# Patient Record
Sex: Female | Born: 1995 | Race: White | Hispanic: No | Marital: Single | Smoking: Never smoker
Health system: Southern US, Community
[De-identification: ages and names within clinical notes are randomized; demographics above are authoritative.]

## PROBLEM LIST (undated history)

## (undated) DIAGNOSIS — J45909 Unspecified asthma, uncomplicated: Secondary | ICD-10-CM

---
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
## 2016-10-16 IMAGING — CT CT HEAD W/O CM
1 series · 16 of 30 positions shown, 20 images · non-contrast
Comparison: None.

CLINICAL DATA: Altered mental status, alcohol intoxication, fall.
Possible knee injury.

EXAM:
CT HEAD WITHOUT CONTRAST
TECHNIQUE: Contiguous axial images were obtained from the base of the skull
through the vertex without intravenous contrast.

[Series 2: head wo · axial · 0.39mm/px · z∈[-11,+121]mm · 16 of 30 slices shown, 20 images]
[im 2/30  brain]
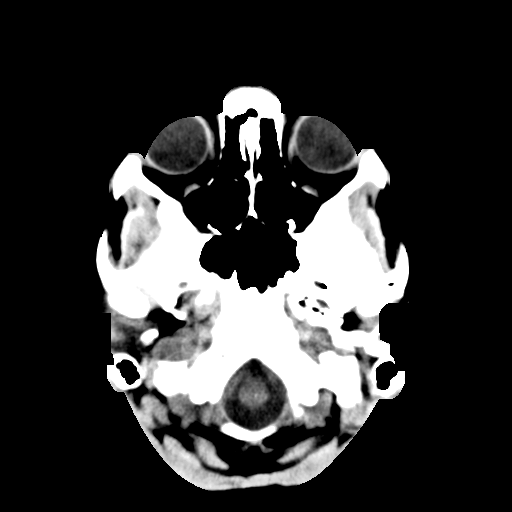
[im 2/30  bone]
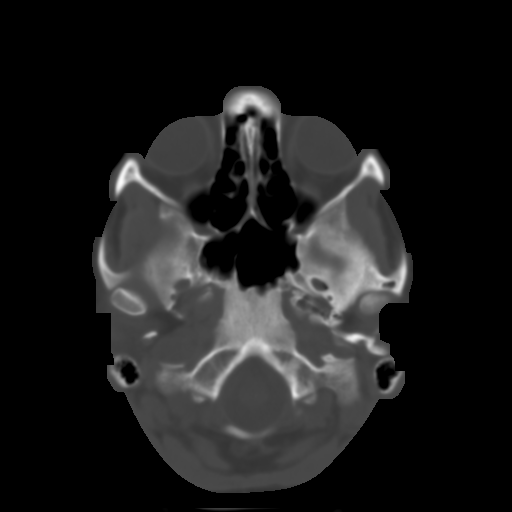
[im 4/30  brain]
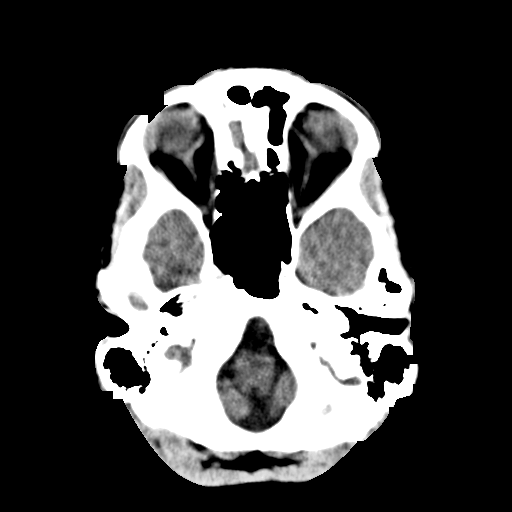
[im 6/30  brain]
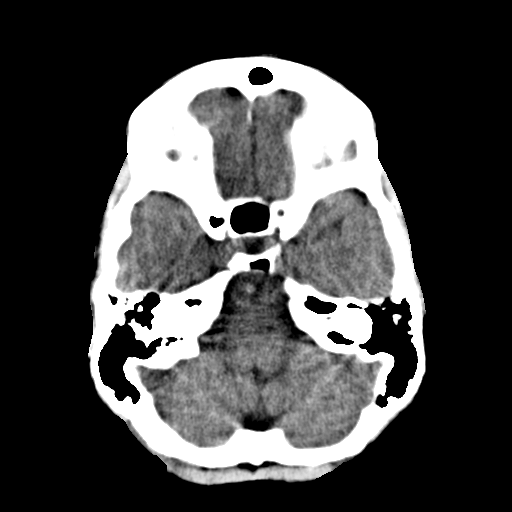
[im 8/30  brain]
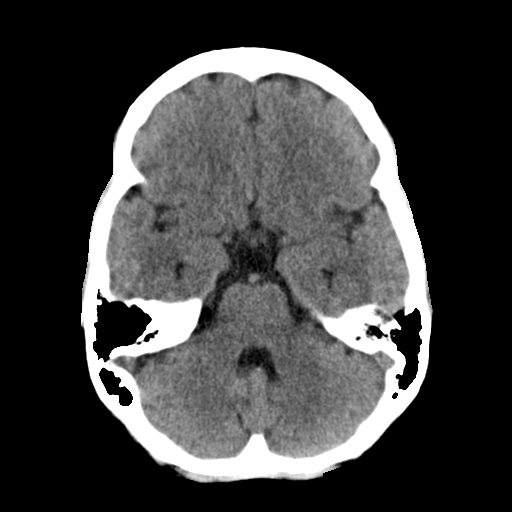
[im 9/30  brain]
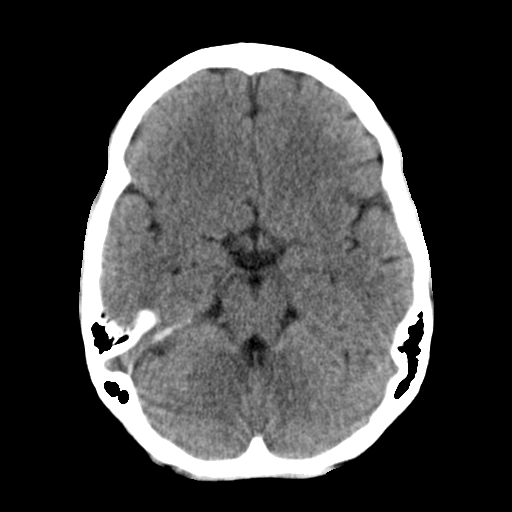
[im 9/30  bone]
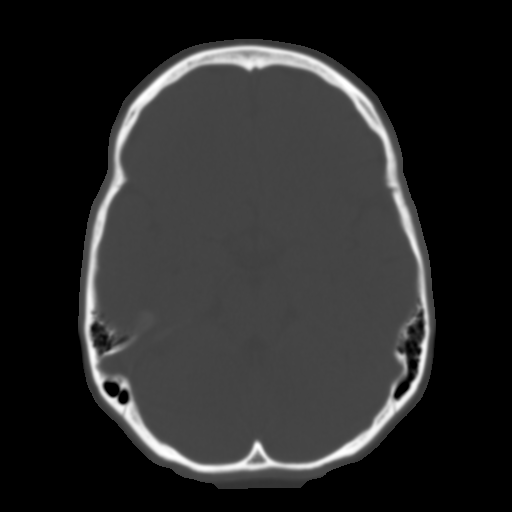
[im 11/30  brain]
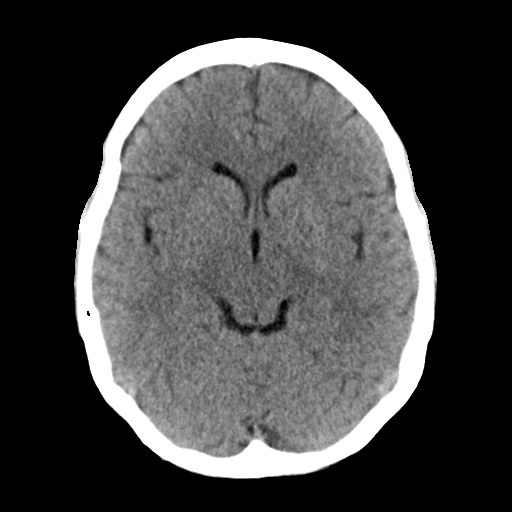
[im 13/30  brain]
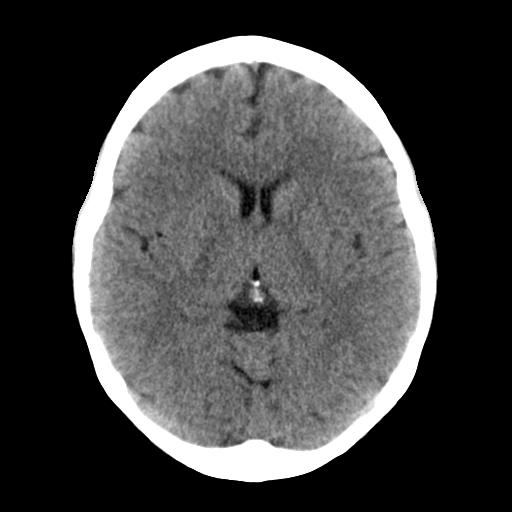
[im 15/30  brain]
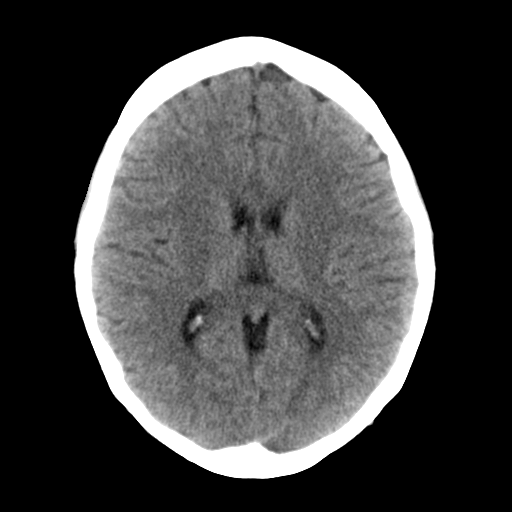
[im 16/30  brain]
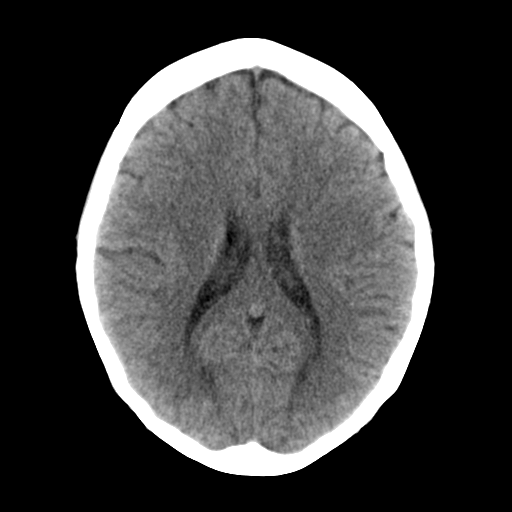
[im 16/30  bone]
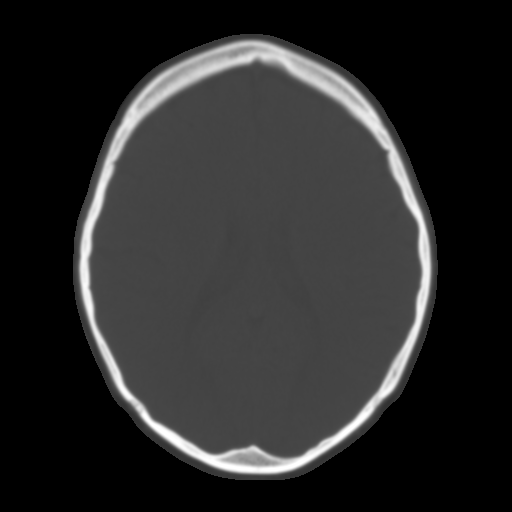
[im 18/30  brain]
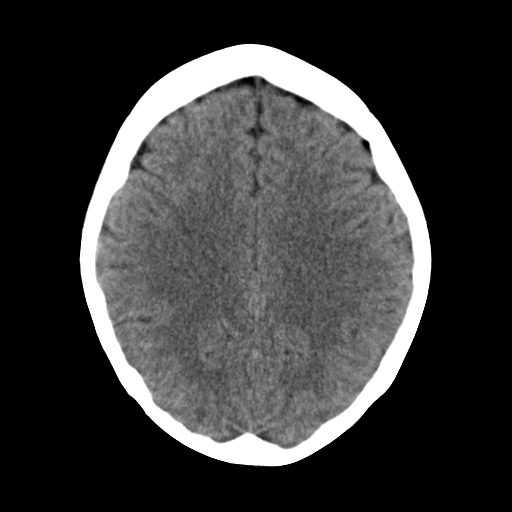
[im 20/30  brain]
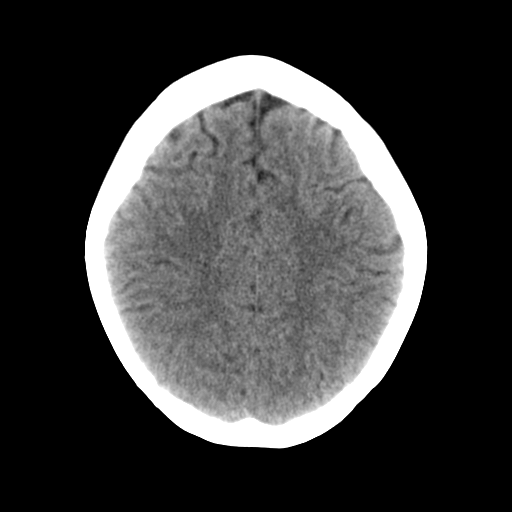
[im 22/30  brain]
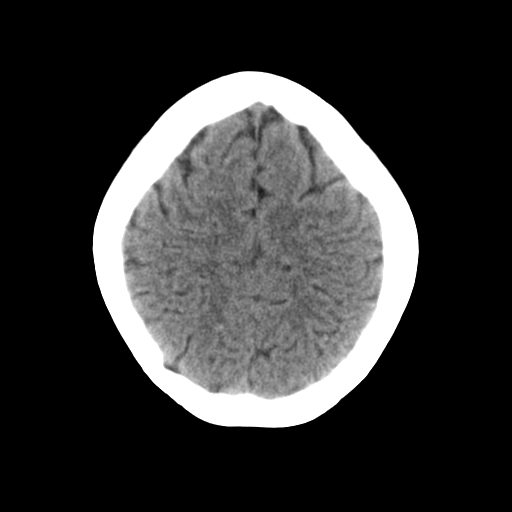
[im 23/30  brain]
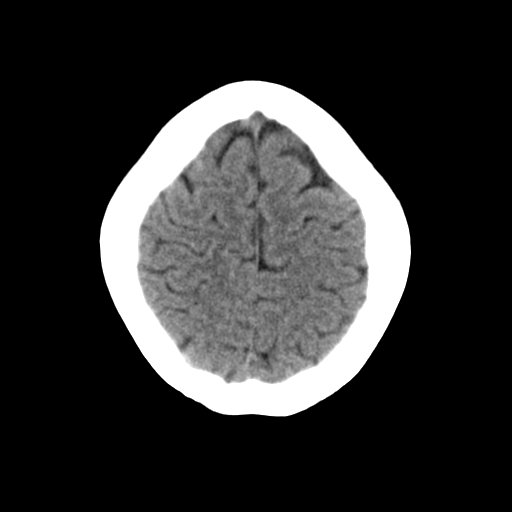
[im 23/30  bone]
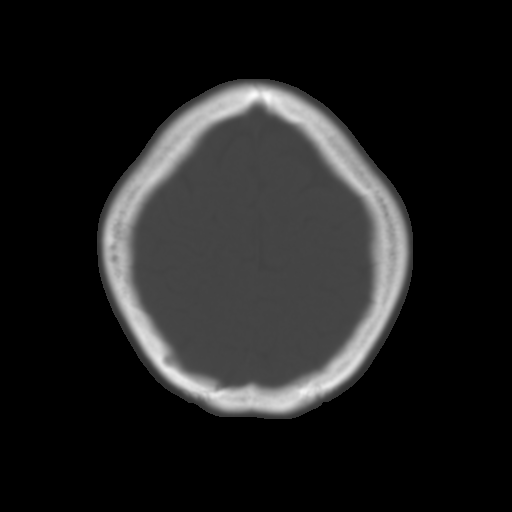
[im 25/30  brain]
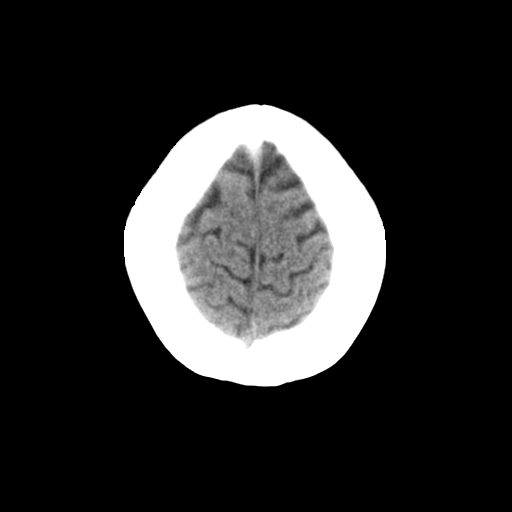
[im 27/30  brain]
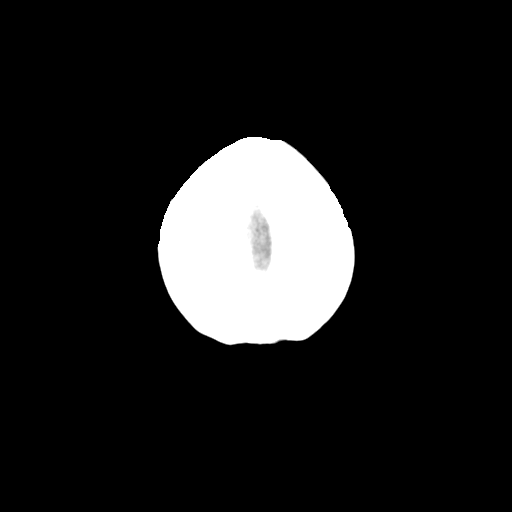
[im 29/30  brain]
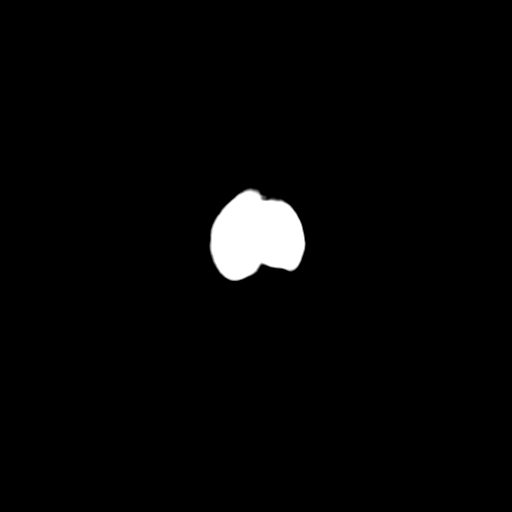

[16 of 30 positions shown; findings below may reference images not displayed]

FINDINGS: The ventricles and sulci are normal. No intraparenchymal hemorrhage,
mass effect nor midline shift. No acute large vascular territory
infarcts.

No abnormal extra-axial fluid collections. Basal cisterns are
patent.

No skull fracture. The included ocular globes and orbital contents
are non-suspicious. The mastoid aircells and included paranasal
sinuses are well-aerated.
IMPRESSION: Normal noncontrast CT head.

## 2018-02-18 ENCOUNTER — Other Ambulatory Visit: Payer: Self-pay

## 2018-02-18 ENCOUNTER — Emergency Department
Admission: EM | Admit: 2018-02-18 | Discharge: 2018-02-18 | Disposition: A | Discharge status: Home / Self Care | Payer: BLUE CROSS/BLUE SHIELD | Attending: Emergency Medicine | Admitting: Emergency Medicine

## 2018-02-18 ENCOUNTER — Encounter: Payer: Self-pay | Admitting: Emergency Medicine

## 2018-02-18 ENCOUNTER — Emergency Department: Payer: BLUE CROSS/BLUE SHIELD

## 2018-02-18 DIAGNOSIS — Y939 Activity, unspecified: Secondary | ICD-10-CM | POA: Diagnosis not present

## 2018-02-18 DIAGNOSIS — S62316A Displaced fracture of base of fifth metacarpal bone, right hand, initial encounter for closed fracture: Secondary | ICD-10-CM | POA: Diagnosis not present

## 2018-02-18 DIAGNOSIS — S6991XA Unspecified injury of right wrist, hand and finger(s), initial encounter: Secondary | ICD-10-CM | POA: Diagnosis present

## 2018-02-18 DIAGNOSIS — Y999 Unspecified external cause status: Secondary | ICD-10-CM | POA: Diagnosis not present

## 2018-02-18 DIAGNOSIS — J45909 Unspecified asthma, uncomplicated: Secondary | ICD-10-CM | POA: Insufficient documentation

## 2018-02-18 DIAGNOSIS — Y92214 College as the place of occurrence of the external cause: Secondary | ICD-10-CM | POA: Diagnosis not present

## 2018-02-18 DIAGNOSIS — S62339A Displaced fracture of neck of unspecified metacarpal bone, initial encounter for closed fracture: Secondary | ICD-10-CM

## 2018-02-18 DIAGNOSIS — W2209XA Striking against other stationary object, initial encounter: Secondary | ICD-10-CM | POA: Diagnosis not present

## 2018-02-18 DIAGNOSIS — S60221A Contusion of right hand, initial encounter: Secondary | ICD-10-CM

## 2018-02-18 MED ORDER — IBUPROFEN 600 MG PO TABS
600.0000 mg | ORAL_TABLET | Freq: Once | ORAL | Status: AC
  Administered 2018-02-18: 600 mg via ORAL
  Filled 2018-02-18: qty 1

## 2018-02-18 MED ORDER — HYDROCODONE-ACETAMINOPHEN 5-325 MG PO TABS
0.5000 | ORAL_TABLET | Freq: Once | ORAL | Status: AC
  Administered 2018-02-18: 1 via ORAL
  Filled 2018-02-18: qty 1

## 2018-02-18 MED ORDER — HYDROCODONE-ACETAMINOPHEN 5-325 MG PO TABS: 1.0000 | ORAL_TABLET | Freq: Four times a day (QID) | ORAL | 0 refills | Status: AC | PRN

## 2018-02-18 MED ORDER — IBUPROFEN 600 MG PO TABS: 600.0000 mg | ORAL_TABLET | Freq: Three times a day (TID) | ORAL | 0 refills | Status: DC | PRN

## 2018-02-18 NOTE — ED Notes (Addendum)
Pt states on Friday she punched a wall and now has bruising and swelling to outer aspect of posterior right hand. Pulses pos, circ wnl, limited ROM due to pain.

## 2018-02-18 NOTE — ED Notes (Signed)
Patient discharged to home per MD order. Patient in stable condition, and deemed medically cleared by ED provider for discharge. Discharge instructions reviewed with patient/family using "Teach Back"; verbalized understanding of medication education and administration, and information about follow-up care. Denies further concerns. ° °

## 2018-02-18 NOTE — ED Notes (Signed)
Splint applied to right hand per ED tech, pt discharged home with instructions.

## 2018-02-18 NOTE — ED Provider Notes (Signed)
[facility] Emergency Department Provider Note   ____________________________________________   First MD Initiated Contact with Patient 02/18/18 0129     (approximate)  I have reviewed the triage vital signs and the nursing notes.   HISTORY  Chief Complaint Hand Injury    HPI Shelby Reyes is a 22 y.o. female who [facility] with a chief complaint of right hand pain and injury.  Patient reports she punched a wall 2 nights ago with her dominant hand.  Presents with painful and swollen right hand.  Denies numbness or tingling.  Denies other injuries or complaints.  Denies human bite.   Past Medical History:  Diagnosis Date  . Asthma     There are no active problems to display for this patient.   History reviewed. No pertinent surgical history.  Prior to Admission medications   Medication Sig Start Date End Date Taking? Authorizing Provider  HYDROcodone-acetaminophen (NORCO) 5-325 MG tablet Take 1 tablet by mouth every 6 (six) hours as needed for moderate pain. 02/18/18   Irean Hong, MD  ibuprofen (ADVIL,MOTRIN) 600 MG tablet Take 1 tablet (600 mg total) by mouth every 8 (eight) hours as needed. 02/18/18   Irean Hong, MD    Allergies Patient has no known allergies.  No family history on file.  Social History Social History   Tobacco Use  . Smoking status: Never Smoker  . Smokeless tobacco: Never Used  Substance Use Topics  . Alcohol use: Yes  . Drug use: No    Review of Systems  Constitutional: No fever/chills. Eyes: No visual changes. ENT: No sore throat. Cardiovascular: Denies chest pain. Respiratory: Denies shortness of breath. Gastrointestinal: No abdominal pain.  No nausea, no vomiting.  No diarrhea.  No constipation. Genitourinary: Negative for dysuria. Musculoskeletal: Positive for right hand pain and injury.  Negative for back pain. Skin: Negative for rash. Neurological: Negative for headaches,  focal weakness or numbness.   ____________________________________________   PHYSICAL EXAM:  VITAL SIGNS: ED Triage Vitals  Enc Vitals Group     BP 02/18/18 0028 (!) 142/93     Pulse Rate 02/18/18 0028 (!) 104     Resp 02/18/18 0028 16     Temp 02/18/18 0028 98 F (36.7 C)     Temp Source 02/18/18 0028 Oral     SpO2 02/18/18 0028 99 %     Weight 02/18/18 0029 125 lb (56.7 kg)     Height 02/18/18 0029  (1.626 m)     Head Circumference --      Peak Flow --      Pain Score 02/18/18 0029 6     Pain Loc --      Pain Edu? --      Excl. in GC? --     Constitutional: Alert and oriented. Well appearing and in no acute distress. Eyes: Conjunctivae are normal. PERRL. EOMI. Head: Atraumatic. Nose: No congestion/rhinnorhea. Mouth/Throat: Mucous membranes are moist.  Oropharynx non-erythematous. Neck: No stridor.  No cervical spine tenderness to palpation. Cardiovascular: Normal rate, regular rhythm. Grossly normal heart sounds.  Good peripheral circulation. Respiratory: Normal respiratory effort.  No retractions. Lungs CTAB. Gastrointestinal: Soft and nontender. No distention. No abdominal bruits. No CVA tenderness. Musculoskeletal:  Right hand: Mild swelling and ecchymosis over dorsal fourth and fifth metatarsal areas.  Limited range of motion secondary to pain.  2+ radial pulse.  Brisk, less than 5-second capillary refill. Neurologic:  Normal speech and language.  No gross focal neurologic deficits are appreciated. No gait instability. Skin:  Skin is warm, dry and intact. No rash noted. Psychiatric: Mood and affect are normal. Speech and behavior are normal.  ____________________________________________   LABS (all labs ordered are listed, but only abnormal results are displayed)  Labs Reviewed - No data to display ____________________________________________  EKG  None ____________________________________________  RADIOLOGY  ED MD interpretation: Possible  nondisplaced fracture of the fifth metatarsal neck visualized only on 1 out of 3 views.  Official radiology report(s): Dg Hand Complete Right  Result Date: 02/18/2018 CLINICAL DATA:  Pain, bruising and swelling after punching a wall 2 days ago. EXAM: RIGHT HAND - COMPLETE 3+ VIEW COMPARISON:  None. FINDINGS: Probable nondisplaced fracture of the fifth metacarpal neck, appreciated only on the AP view. No additional acute fracture. Alignment joint spaces are maintained. Mild dorsal soft tissue edema. IMPRESSION: Probable nondisplaced fracture of the fifth metacarpal neck, visualized only on a single view. Electronically Signed   By: Rubye Oaks M.D.   On: 02/18/2018 01:14    ____________________________________________   PROCEDURES  Procedure(s) performed:    Splint performed by ED tech; reviewed splint after application .Splint Application Date/Time: 02/18/2018 2:02 AM Performed by: Irean Hong, MD Authorized by: Irean Hong, MD   Consent:    Consent obtained:  Verbal   Consent given by:  Patient   Risks discussed:  Discoloration, numbness, pain and swelling Pre-procedure details:    Sensation:  Normal Procedure details:    Laterality:  Right   Location:  Hand   Hand:  R hand   Splint type: boxer's splint.   Supplies:  Cotton padding and Ortho-Glass Post-procedure details:    Pain:  Improved   Sensation:  Normal   Skin color:  Pink   Patient tolerance of procedure:  Tolerated well, no immediate complications    Critical Care performed: No  ____________________________________________   INITIAL IMPRESSION / ASSESSMENT AND PLAN / ED COURSE  As part of my medical decision making, I reviewed the following data within the electronic MEDICAL RECORD NUMBER Nursing notes reviewed and incorporated, Old chart reviewed, Radiograph reviewed and Notes from prior ED visits   22 year old female who presents with right hand pain and swelling tonight after punching a wall.  1/3  x-ray views suggestive of nondisplaced fifth metacarpal fracture.  Will administer Motrin, Norco for pain.  Will place in a boxer splint and refer to hand surgery for follow-up.  Strict return precautions given.  Patient verbalizes understanding and agrees with plan of care.      ____________________________________________   FINAL CLINICAL IMPRESSION(S) / ED DIAGNOSES  Final diagnoses:  Contusion of right hand, initial encounter  Closed boxer's fracture, initial encounter     ED Discharge Orders        Ordered    ibuprofen (ADVIL,MOTRIN) 600 MG tablet  Every 8 hours PRN     02/18/18 0138    HYDROcodone-acetaminophen (NORCO) 5-325 MG tablet  Every 6 hours PRN     02/18/18 0138       Note:  This document was prepared using Dragon voice recognition software and may include unintentional dictation errors.    Irean Hong, MD 02/18/18 [phone]

## 2018-02-18 NOTE — Discharge Instructions (Signed)
1.  You may take pain medicines as needed (Motrin/Norco). 2.  Keep splint in and dry.  Elevate affected area and apply ice over splint several times daily to reduce swelling. 3.  Return to the ER for worsening symptoms, persistent vomiting, difficulty breathing or other concerns.

## 2018-02-18 NOTE — ED Triage Notes (Signed)
Pt in via POV with complaints of pain/swelling to right hand, states, "I punched a wall two nights ago."  Vitals WDL, NAD noted at this time.

## 2018-07-14 ENCOUNTER — Emergency Department
Admission: EM | Admit: 2018-07-14 | Discharge: 2018-07-14 | Disposition: A | Discharge status: Home / Self Care | Payer: BLUE CROSS/BLUE SHIELD | Attending: Emergency Medicine | Admitting: Emergency Medicine

## 2018-07-14 ENCOUNTER — Emergency Department: Payer: BLUE CROSS/BLUE SHIELD

## 2018-07-14 ENCOUNTER — Other Ambulatory Visit: Payer: Self-pay

## 2018-07-14 DIAGNOSIS — J45909 Unspecified asthma, uncomplicated: Secondary | ICD-10-CM | POA: Insufficient documentation

## 2018-07-14 DIAGNOSIS — Y92008 Other place in unspecified non-institutional (private) residence as the place of occurrence of the external cause: Secondary | ICD-10-CM | POA: Diagnosis not present

## 2018-07-14 DIAGNOSIS — S63682A Other sprain of left thumb, initial encounter: Secondary | ICD-10-CM | POA: Diagnosis not present

## 2018-07-14 DIAGNOSIS — Y999 Unspecified external cause status: Secondary | ICD-10-CM | POA: Diagnosis not present

## 2018-07-14 DIAGNOSIS — W51XXXA Accidental striking against or bumped into by another person, initial encounter: Secondary | ICD-10-CM | POA: Insufficient documentation

## 2018-07-14 DIAGNOSIS — S6992XA Unspecified injury of left wrist, hand and finger(s), initial encounter: Secondary | ICD-10-CM | POA: Diagnosis present

## 2018-07-14 DIAGNOSIS — Y9389 Activity, other specified: Secondary | ICD-10-CM | POA: Insufficient documentation

## 2018-07-14 MED ORDER — IBUPROFEN 600 MG PO TABS: 600.0000 mg | ORAL_TABLET | Freq: Three times a day (TID) | ORAL | 0 refills | Status: AC | PRN

## 2018-07-14 NOTE — ED Triage Notes (Signed)
Pt states she was trying to prevent someone from falling off a porch and injured her left thumb yesterday

## 2018-07-14 NOTE — ED Provider Notes (Signed)
[facility] Emergency Department Provider Note  ____________________________________________   First MD Initiated Contact with Patient 07/14/18 1207     (approximate)  I have reviewed the triage vital signs and the nursing notes.   HISTORY  Chief Complaint Finger Injury    HPI Shelby Reyes is a 22 y.o. female presents emergency department complaining of left thumb pain.  She states last night she tried to catch someone from falling and her thumb bent back.  She states that it hurts to bend and move the thumb.  She cannot touch her thumb to her small finger.  Denies any other injuries.    Past Medical History:  Diagnosis Date  . Asthma     There are no active problems to display for this patient.   History reviewed. No pertinent surgical history.  Prior to Admission medications   Medication Sig Start Date End Date Taking? Authorizing Provider  HYDROcodone-acetaminophen (NORCO) 5-325 MG tablet Take 1 tablet by mouth every 6 (six) hours as needed for moderate pain. 02/18/18   Irean Hong, MD  ibuprofen (ADVIL,MOTRIN) 600 MG tablet Take 1 tablet (600 mg total) by mouth every 8 (eight) hours as needed. 07/14/18   Faythe Ghee, PA-C    Allergies Patient has no known allergies.  No family history on file.  Social History Social History   Tobacco Use  . Smoking status: Never Smoker  . Smokeless tobacco: Never Used  Substance Use Topics  . Alcohol use: Yes  . Drug use: No    Review of Systems  Constitutional: No fever/chills Eyes: No visual changes. ENT: No sore throat. Respiratory: Denies cough Genitourinary: Negative for dysuria. Musculoskeletal: Negative for back pain.  Positive for left thumb pain Skin: Negative for rash.    ____________________________________________   PHYSICAL EXAM:  VITAL SIGNS: ED Triage Vitals  Enc Vitals Group     BP 07/14/18 1138 118/82     Pulse Rate 07/14/18 1138 (!) 101     Resp 07/14/18 1138  14     Temp 07/14/18 1138 98.4 F (36.9 C)     Temp Source 07/14/18 1138 Oral     SpO2 07/14/18 1138 97 %     Weight 07/14/18 1138 126 lb (57.2 kg)     Height 07/14/18 1138 5\' 4"  (1.626 m)     Head Circumference --      Peak Flow --      Pain Score 07/14/18 1144 6     Pain Loc --      Pain Edu? --      Excl. in GC? --     Constitutional: Alert and oriented. Well appearing and in no acute distress. Eyes: Conjunctivae are normal.  Head: Atraumatic. Nose: No congestion/rhinnorhea. Mouth/Throat: Mucous membranes are moist.   Neck:  supple no lymphadenopathy noted Cardiovascular: Normal rate, regular rhythm. Respiratory: Normal respiratory effort.  No retractions GU: deferred Musculoskeletal: The left thumb is very bruised and swollen.  Thenar muscle is bruised and swollen.  Decreased range of motion of the left thumb.  She cannot touch the left thumb to the left fifth finger.  Neurovascular is intact.  Neurologic:  Normal speech and language.  Skin:  Skin is warm, dry and intact. No rash noted. Psychiatric: Mood and affect are normal. Speech and behavior are normal.  ____________________________________________   LABS (all labs ordered are listed, but only abnormal results are displayed)  Labs Reviewed - No data to display ____________________________________________   ____________________________________________  RADIOLOGY  X-ray of the left thumb is negative for fracture  ____________________________________________   PROCEDURES  Procedure(s) performed: Thumb spica splint was applied by the tech  Procedures    ____________________________________________   INITIAL IMPRESSION / ASSESSMENT AND PLAN / ED COURSE  Pertinent labs & imaging results that were available during my care of the patient were reviewed by me and considered in my medical decision making (see chart for details).   Patient is 22 year old female presents emergency department complaining of  left thumb pain.  On physical exam left thumb and thenar muscle are grossly bruised and swollen.  X-ray of the left thumb is negative for fracture.  X-ray results were discussed with patient.  She is placed in a thumb spica splint.  She is to follow-up with orthopedics if not improving in 3 to 5 days.  She is to apply ice and keep the thumb elevated.  Take Tylenol and ibuprofen for pain as needed.  She states she understands will comply with our instructions.  Was discharged in stable condition.     As part of my medical decision making, I reviewed the following data within the electronic MEDICAL RECORD NUMBER Nursing notes reviewed and incorporated, Old chart reviewed, Radiograph reviewed x-ray left thumb is negative., Notes from prior ED visits and [place] Controlled Substance Database  ____________________________________________   FINAL CLINICAL IMPRESSION(S) / ED DIAGNOSES  Final diagnoses:  Other sprain of left thumb, initial encounter      NEW MEDICATIONS STARTED DURING THIS VISIT:  Current Discharge Medication List       Note:  This document was prepared using Dragon voice recognition software and may include unintentional dictation errors.    Faythe Ghee, PA-C 07/14/18 1730    Minna Antis, MD 07/15/18 [phone]

## 2018-07-14 NOTE — Discharge Instructions (Addendum)
Follow-up with [facility] clinic orthopedics if not better in 5 7 days.  Wear the thumb spica splint to reduce the swelling and inflammation of the thumb.  If you do not have full range of motion of the thumb within 3 days she will need to see orthopedics.  Take ibuprofen and Tylenol as needed for pain.

## 2018-07-14 NOTE — ED Notes (Signed)
Pt injured left thumb while trying to catch someone from falling - inner pad of thumb is blue/purple

## 2019-11-12 IMAGING — DX DG FINGER THUMB 2+V*L*
3 series · 3 of 3 positions shown · non-contrast
Comparison: None.

CLINICAL DATA: Initial encounter for Pt states she was trying to
prevent someone from falling off a porch and injured her left thumb
yesterday. Some bruising noted to the inner pad of thumb.

EXAM:
LEFT THUMB 2+V

[finger ap]
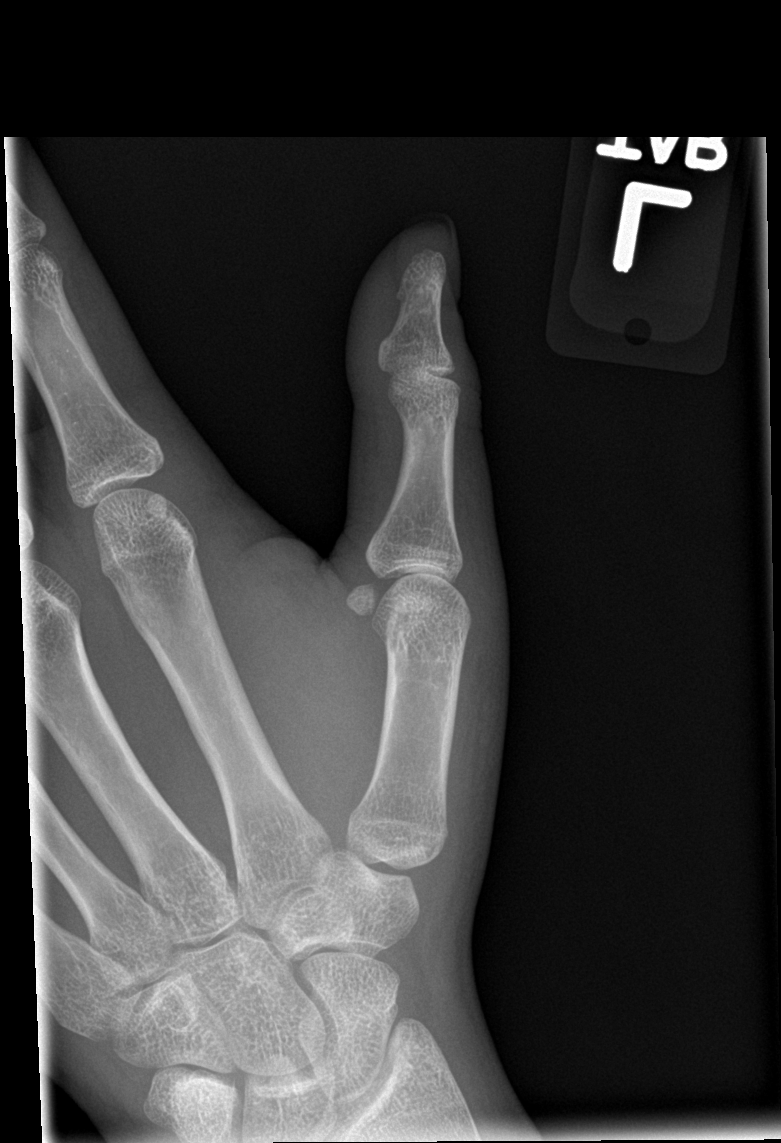

[finger obl]
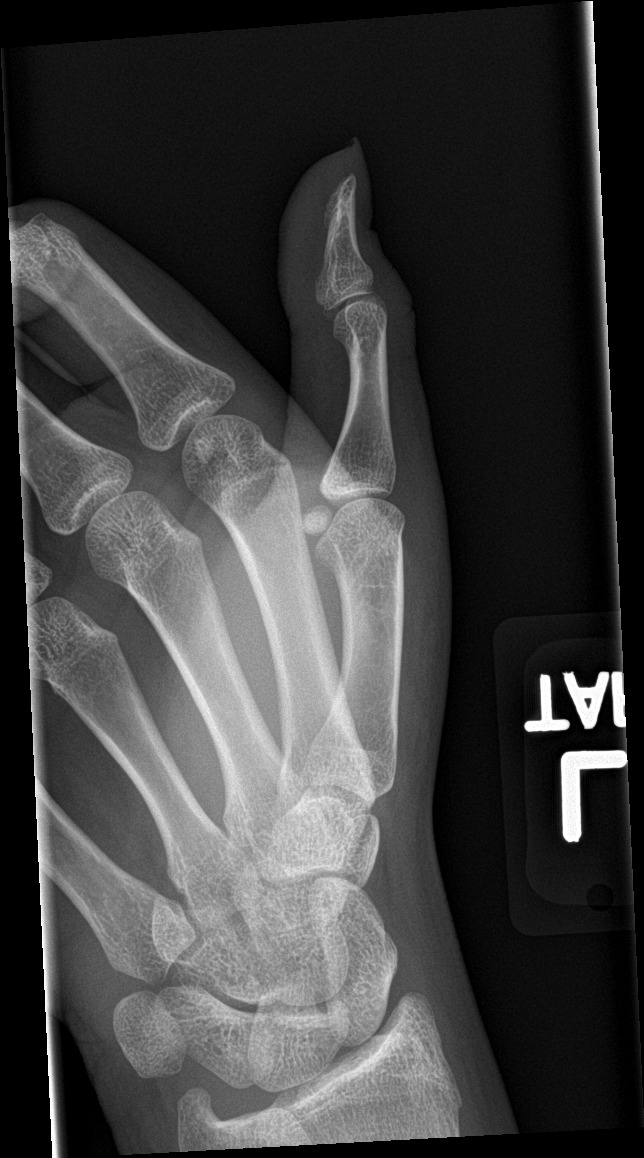

[finger lat]
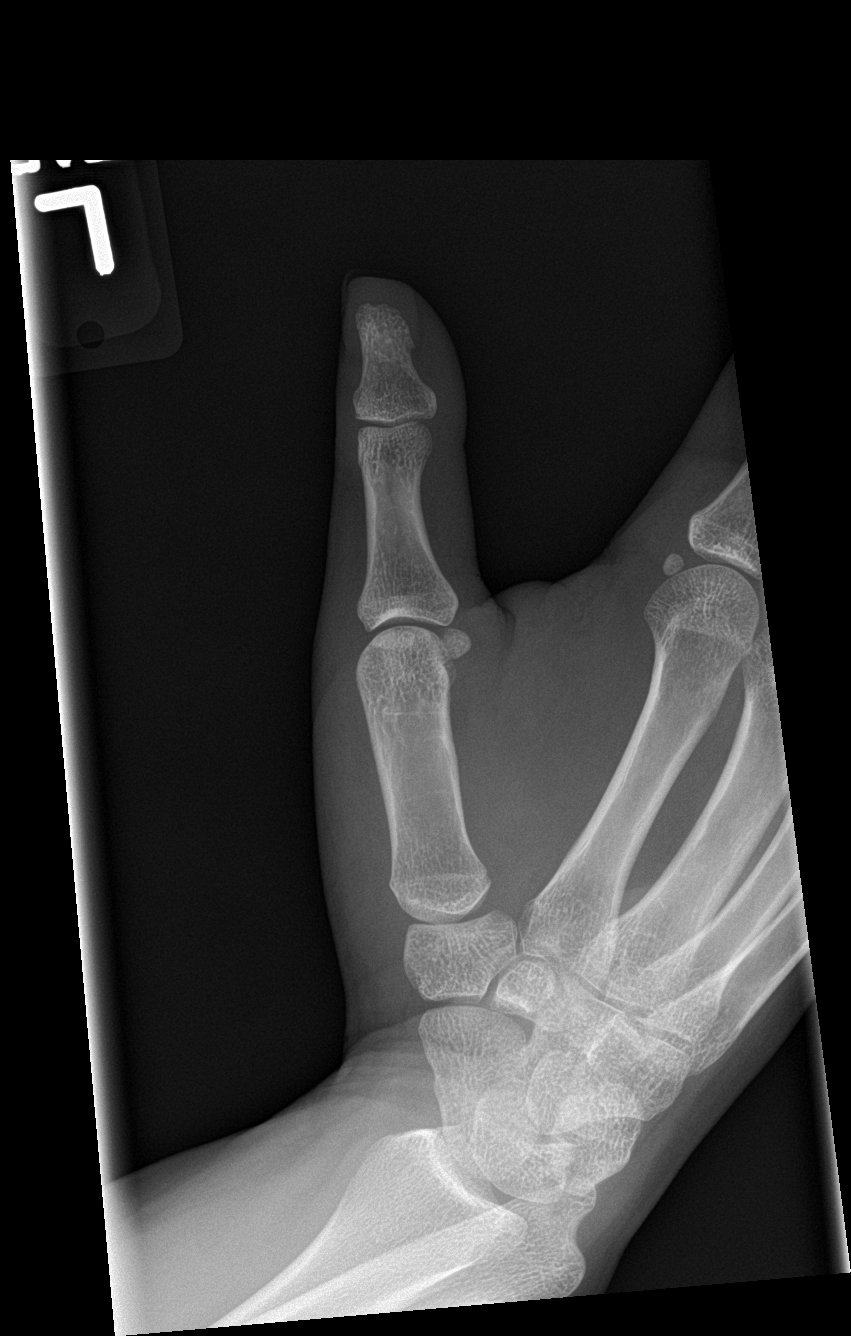

[3 of 3 positions shown; findings below may reference images not displayed]

FINDINGS: No acute fracture or dislocation.  No radiopaque foreign object.
IMPRESSION: No acute osseous abnormality.
